# Patient Record
Sex: Female | Born: 1985 | Race: White | Hispanic: Yes | Marital: Married
Health system: Southern US, Community
[De-identification: ages and names within clinical notes are randomized; demographics above are authoritative.]

---

## 2004-04-01 ENCOUNTER — Ambulatory Visit (HOSPITAL_COMMUNITY): Admission: RE | Admit: 2004-04-01 | Discharge: 2004-04-01 | Payer: Self-pay | Admitting: *Deleted

## 2004-05-24 ENCOUNTER — Ambulatory Visit (HOSPITAL_COMMUNITY): Admission: RE | Admit: 2004-05-24 | Discharge: 2004-05-24 | Payer: Self-pay | Admitting: *Deleted

## 2004-06-22 ENCOUNTER — Ambulatory Visit (HOSPITAL_COMMUNITY): Admission: RE | Admit: 2004-06-22 | Discharge: 2004-06-22 | Payer: Self-pay | Admitting: *Deleted

## 2004-08-12 ENCOUNTER — Inpatient Hospital Stay (HOSPITAL_COMMUNITY): Admission: AD | Admit: 2004-08-12 | Discharge: 2004-08-12 | Payer: Self-pay | Admitting: Obstetrics & Gynecology

## 2004-08-12 ENCOUNTER — Ambulatory Visit: Payer: Self-pay | Admitting: Obstetrics & Gynecology

## 2004-08-13 ENCOUNTER — Ambulatory Visit: Payer: Self-pay | Admitting: Obstetrics & Gynecology

## 2004-08-13 ENCOUNTER — Observation Stay (HOSPITAL_COMMUNITY): Admission: AD | Admit: 2004-08-13 | Discharge: 2004-08-13 | Payer: Self-pay | Admitting: Obstetrics & Gynecology

## 2004-08-15 ENCOUNTER — Inpatient Hospital Stay (HOSPITAL_COMMUNITY): Admission: AD | Admit: 2004-08-15 | Discharge: 2004-08-17 | Payer: Self-pay | Admitting: Obstetrics and Gynecology

## 2010-08-08 ENCOUNTER — Emergency Department: Payer: Self-pay | Admitting: Internal Medicine

## 2014-02-03 ENCOUNTER — Emergency Department: Payer: Self-pay | Admitting: Emergency Medicine

## 2014-02-07 ENCOUNTER — Inpatient Hospital Stay: Payer: Self-pay | Admitting: General Practice

## 2014-07-26 NOTE — Op Note (Signed)
PATIENT NAME:  Jacqueline Saunders, Jacqueline Saunders MR#:  [id number] DATE OF BIRTH:  1985-08-19  DATE OF PROCEDURE:  02/07/2014  PREOPERATIVE DIAGNOSES:  1.  Right tibial shaft fracture.  2.  Right distal fibula fracture.   POSTOPERATIVE DIAGNOSES:  1.  Right tibial shaft fracture.  2.  Right distal fibula fracture.   PROCEDURES PERFORMED:  1.  Reduction and intramedullary nailing of the right tibial shaft fracture.  2.  Open reduction and internal fixation of a right distal fibula fracture.   SURGEON: Illene Labrador. Angie Fava., MD.   ANESTHESIA: Spinal.   ESTIMATED BLOOD LOSS: 100 mL.   FLUIDS REPLACED: 1300 mL of crystalloid.   TOURNIQUET TIME: 60 minutes.   DRAINS: None.   IMPLANTS UTILIZED: Synthes 10 mm x 300 mm titanium cannulated tibial nail, four 5.0 mm locking screws and a 5 mm titanium end cap, Synthes 7-hole 1/3 tubular plate with seven 3.5 mm cortical screws.   INDICATIONS FOR SURGERY: The patient is a 29 year old female who fell while getting out of a car and twisted her right lower leg. She sustained a fracture of the shaft of the right tibia as well as distal fibular fracture. After discussion of the risks and benefits of surgical intervention, the patient expressed understanding of the risks and benefits and agreed with plans for surgical intervention.   PROCEDURE IN DETAIL: The patient was brought to the Operating Room and, after adequate spinal anesthesia was achieved, a tourniquet was placed on the patient's upper right thigh. The patient's right leg was cleaned and prepped with alcohol and DuraPrep draped in the usual sterile fashion. A "timeout" was performed as per usual protocol. An anterior longitudinal incision was made along the medial aspect of the patellar tendon. Dissection was carried down to the anterior margin of the tibia. Under visualization with the C-arm, entry point was identified using a threaded Steinmann pin. The pin was advanced accordingly with good position noted. A  step drill was used to enlarge the opening. The Steinmann pin was then replaced with a beaded reaming rod. The reaming guidewire was then advanced down the intramedullary canal and past the fracture site into the distal fragment. Good position was noted in both AP and lateral planes. Measurements were obtained and it was felt that a 300 mm length for the nail was appropriate. The canal was reamed in a sequential fashion up to an 11 mm diameter. A 10 mm x 300 mm cannulated tibial nail was then advanced over the guidewire. Good position was noted and good maintenance of the reduction appreciated. Guidewire was removed. Proximal locking screws (5.0 mm) were inserted. Outrigger device was then removed. There was displacement of the distal fibula fracture, so it was elected to proceed with open reduction and internal fixation of the distal fibula for final locking of the tibial nail. The lower extremity was exsanguinated using an Esmarch and the tourniquet was inflated to 300 mmHg. A lateral longitudinal incision was made in line with the distal fibula. Dissection was carried down to the fracture site. Soft tissue and hematoma were removed from the fracture site and reduction was performed and maintained using bone reduction forceps with points. Reduction was confirmed using C-arm. A seven hole 1/3 tubular plate was then contoured to fit along the lateral aspect of the distal fibula and was then secured with a total of six 3.5 mm cortical screws. Excellent maintenance of the reduction was performed. Next, the C-arm was positioned so as to insert two 5.0 mm distal  locking screws in the tibial nail. Good position was noted in both cases. The wounds were irrigated with copious amounts of normal saline with antibiotic solution. The fascia adjacent to the patellar tendon was reapproximated using #1 Vicryl. The wounds were then closed in layers using first #0 Vicryl followed by 2-0 Vicryl. Skin was closed with skin staples.  Tourniquet was deflated after a total tourniquet time of 60 minutes. A posterior splint was applied.   The patient tolerated the procedure well. She was transported to the recovery room in stable condition.    ____________________________ Illene LabradorJames P. Angie FavaHooten Jr., MD jph:at D: 02/08/2014 06:53:43 ET T: 02/08/2014 11:13:12 ET JOB#: [id number]  cc: Fayrene FearingJames P. Angie FavaHooten Jr., MD, <Dictator> JAMES P Angie FavaHOOTEN JR MD ELECTRONICALLY SIGNED 02/09/2014 23:01

## 2014-07-26 NOTE — Discharge Summary (Signed)
PATIENT NAME:  Jacqueline Saunders, Jacqueline Saunders MR#:  [id number] DATE OF BIRTH:  02-16-1986  DATE OF ADMISSION:  02/07/2014 DATE OF DISCHARGE:  02/09/2014  ADMITTING DIAGNOSIS: Right tibial shaft fracture and right distal fibula fracture.   DISCHARGE DIAGNOSIS: Right tibial shaft fracture and right distal fibula fracture.   SURGERY: On 02/07/2014, ORIF right distal tibia, IM nail right tibia.   SURGEON: Dr. Francesco SorJames Saunders.   ANESTHESIA: Spinal.   ESTIMATED BLOOD LOSS: 100 mL.  TOURNIQUET TIME: 60 minutes.  IMPLANTS: Synthes 10 x 300 mm tibial plate, four 5.0 mm locking screws, 5 mm end cap, 7-hole one-third tubular plate, six 3.5 cortical screws.   COMPLICATIONS: None.   HISTORY: Jacqueline BumpersLorena is a 29 year old Hispanic female who sustained an injury to her right lower leg on 02/03/2014 when she is getting out of her car, slipped on wet grass, sustaining a twisting injury to the right leg. Had immediate pain. [facility] the same day. X-rays confirmed distal tibia and fibular shaft fractures that were moderately displaced. She was placed in a posterior splint and told to be nonweightbearing. She was admitted for ORIF, right ankle.   PHYSICAL EXAMINATION: Well-developed, well-nourished. No acute distress. Spanish-speaking. No use of accessory muscles. No nausea, vomiting, or constipation. Tolerating diet.  Tolerating physical therapy. Incision right ankle. Posterior and stirrup splints on. No drainage from the incisions. Dressing clean and intact. Neurovascularly intact proximal and distal to splint. Leg elevated. Polar Care on. Able to wiggle toes. Posterior splint on. Ankle immobilized. Range of motion not assessed. AVIs and TEDs.    HOSPITAL COURSE: After initial admission on 02/07/2014, the patient underwent ORIF right ankle the same day. She had good pain control afterwards and was transported to the orthopedic floor. On postoperative day one, 02/08/2014, was on room  air, had no labs. Physical therapy was begun on that day, nonweightbearing right lower extremity. Polar Care was on and ankle was immobilized in posterior and stirrup splint. On postoperative day 2, she walked 50 feet with physical therapy, and was cleared to be discharged home.   CONDITION AT DISCHARGE: Stable.   DISPOSITION: The patient was sent home with home health physical therapy.   DISCHARGE INSTRUCTIONS: The patient will follow up clinically in orthopedics in 7 to 10 days for removal of her splint and to have her staples removed. Nonweightbearing right lower extremity. Continue icing with Polar Care. Elevate. Continue left knee thigh-high TED hose. Regular diet.   DISCHARGE MEDICATIONS: Please see discharge instructions for complete list of discharge medications.  ____________________________ Jacqueline Knick M. Haskel KhanBerndt, NP amb:sb D: 02/25/2014 12:41:04 ET T: 02/25/2014 12:50:20 ET JOB#: [id number]  cc: Jacqueline [place] M. Haskel KhanBerndt, NP, <Dictator> Jacqueline EkAPRIL M Giovannie Scerbo FNP ELECTRONICALLY SIGNED 03/06/2014 8:55

## 2015-03-15 IMAGING — CR RIGHT TIBIA AND FIBULA - 2 VIEW
1 series · 2 of 2 positions shown · non-contrast
Comparison: None.

CLINICAL DATA: Slipped on the wet pavement and fell.

EXAM:
RIGHT TIBIA AND FIBULA - 2 VIEW

[Series 1: x tib-fib ap right · 0.14mm/px · 2 of 2 slices shown]
[im 1/2]
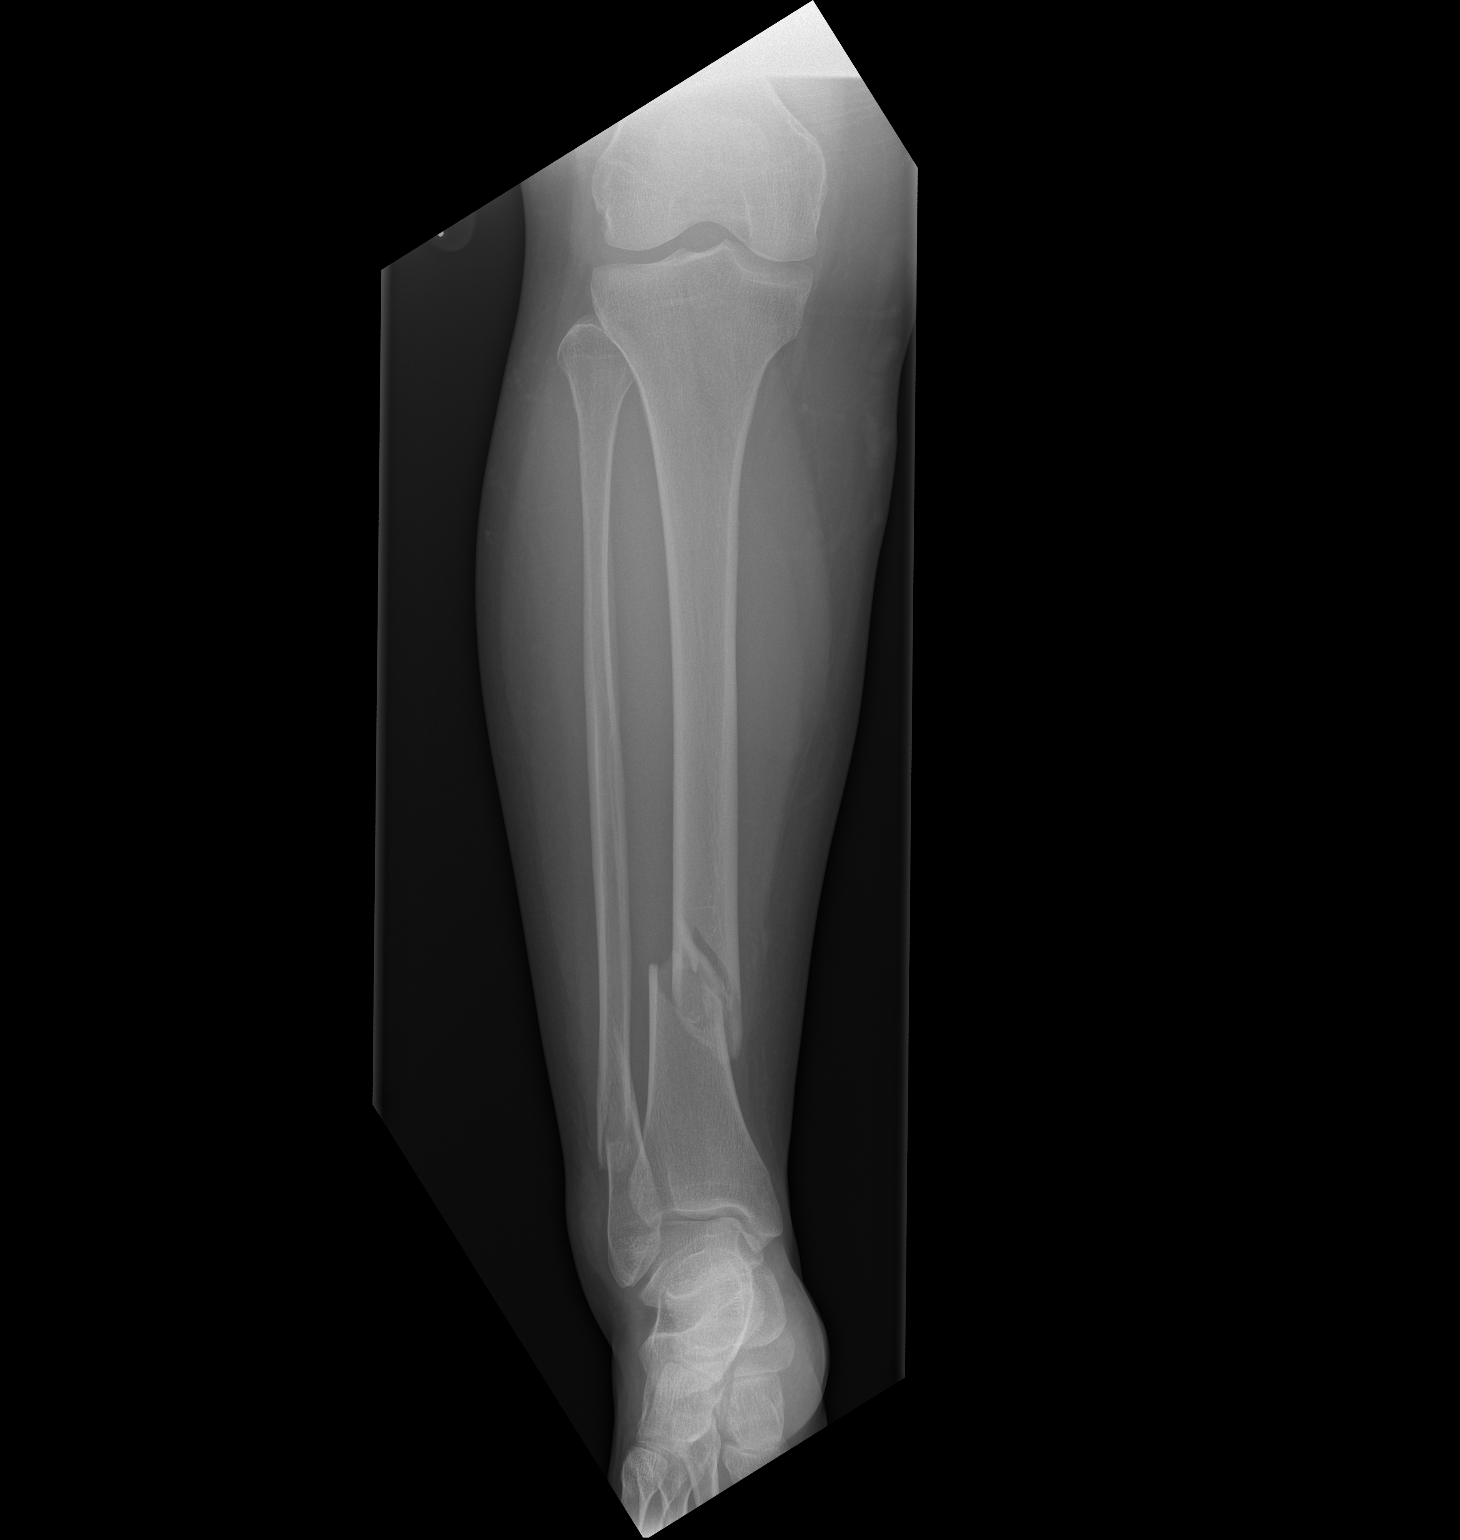
[im 2/2]
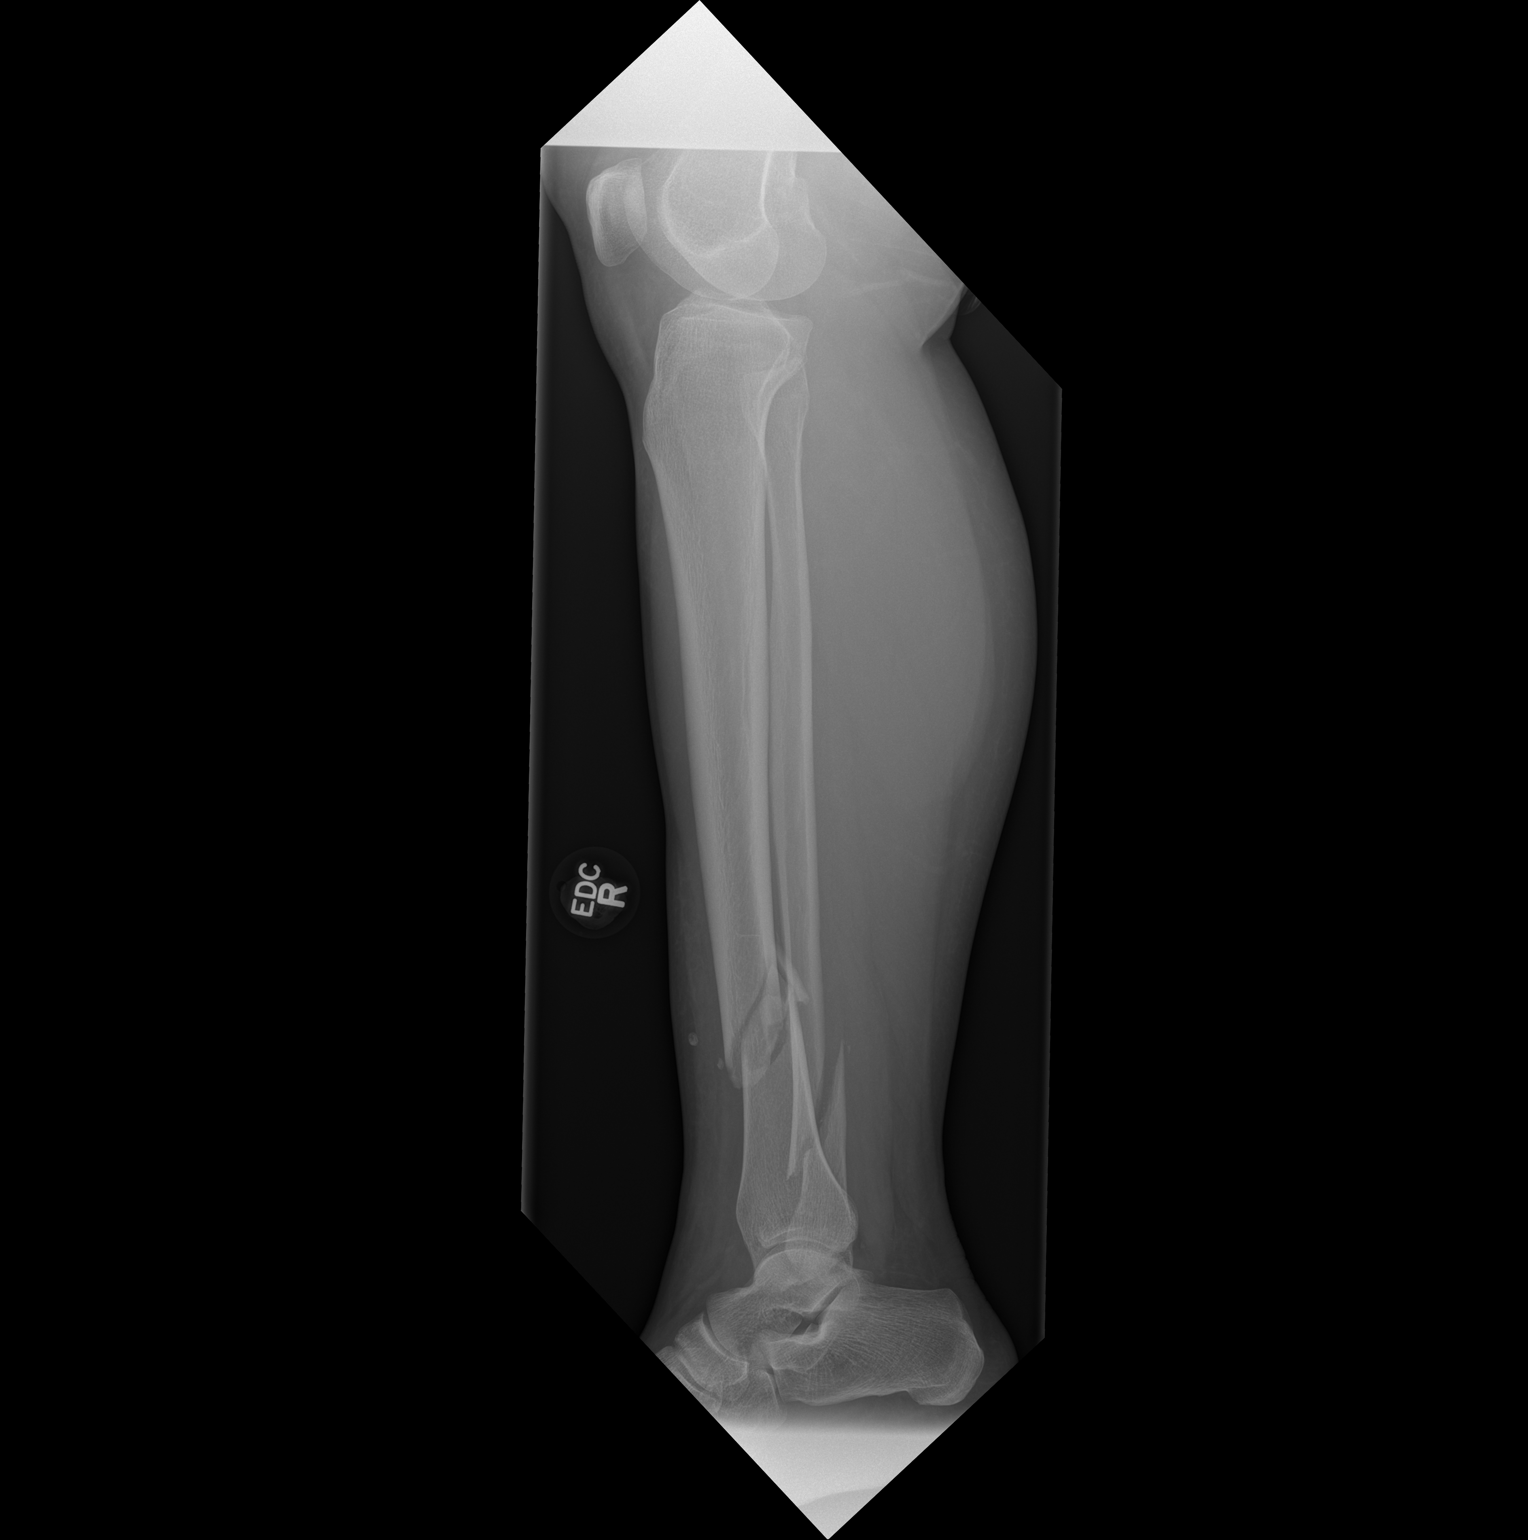

[2 of 2 positions shown; findings below may reference images not displayed]

FINDINGS: The knee and ankle joints are maintained. There are fractures
involving the distal tibia and fibular shafts. The tibia fracture
has a spiral appearance and there is mild lateral displacement. The
fibular fractures a long oblique fracture above the level of the
ankle mortise.
IMPRESSION: Distal tibia and fibular shaft fractures.

## 2015-03-19 IMAGING — CR RIGHT TIBIA AND FIBULA - 2 VIEW
1 series · 3 of 3 positions shown · non-contrast
Comparison: None.

CLINICAL DATA: . reduction internal fixation tibial fracture.

EXAM:
RIGHT TIBIA AND FIBULA - 2 VIEW

[Series 1: ap · 0.17mm/px · 3 of 3 slices shown]
[im 1/3]
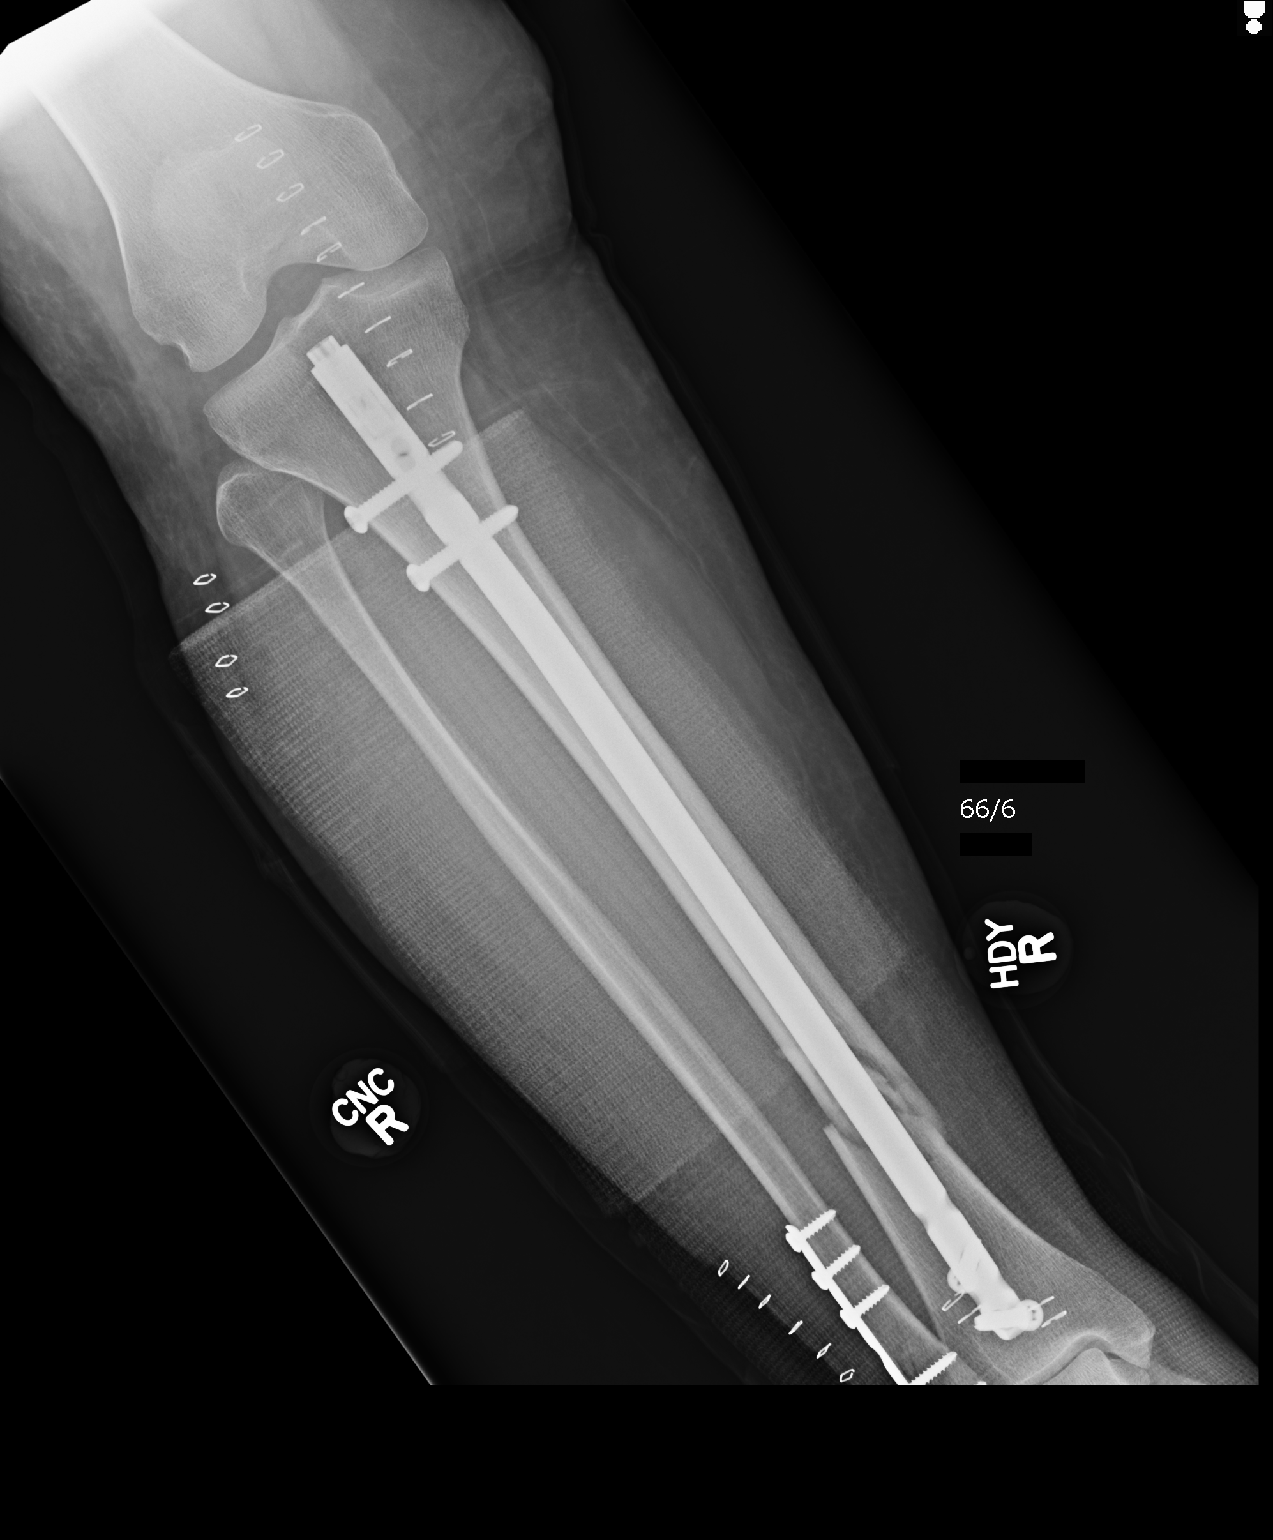
[im 2/3]
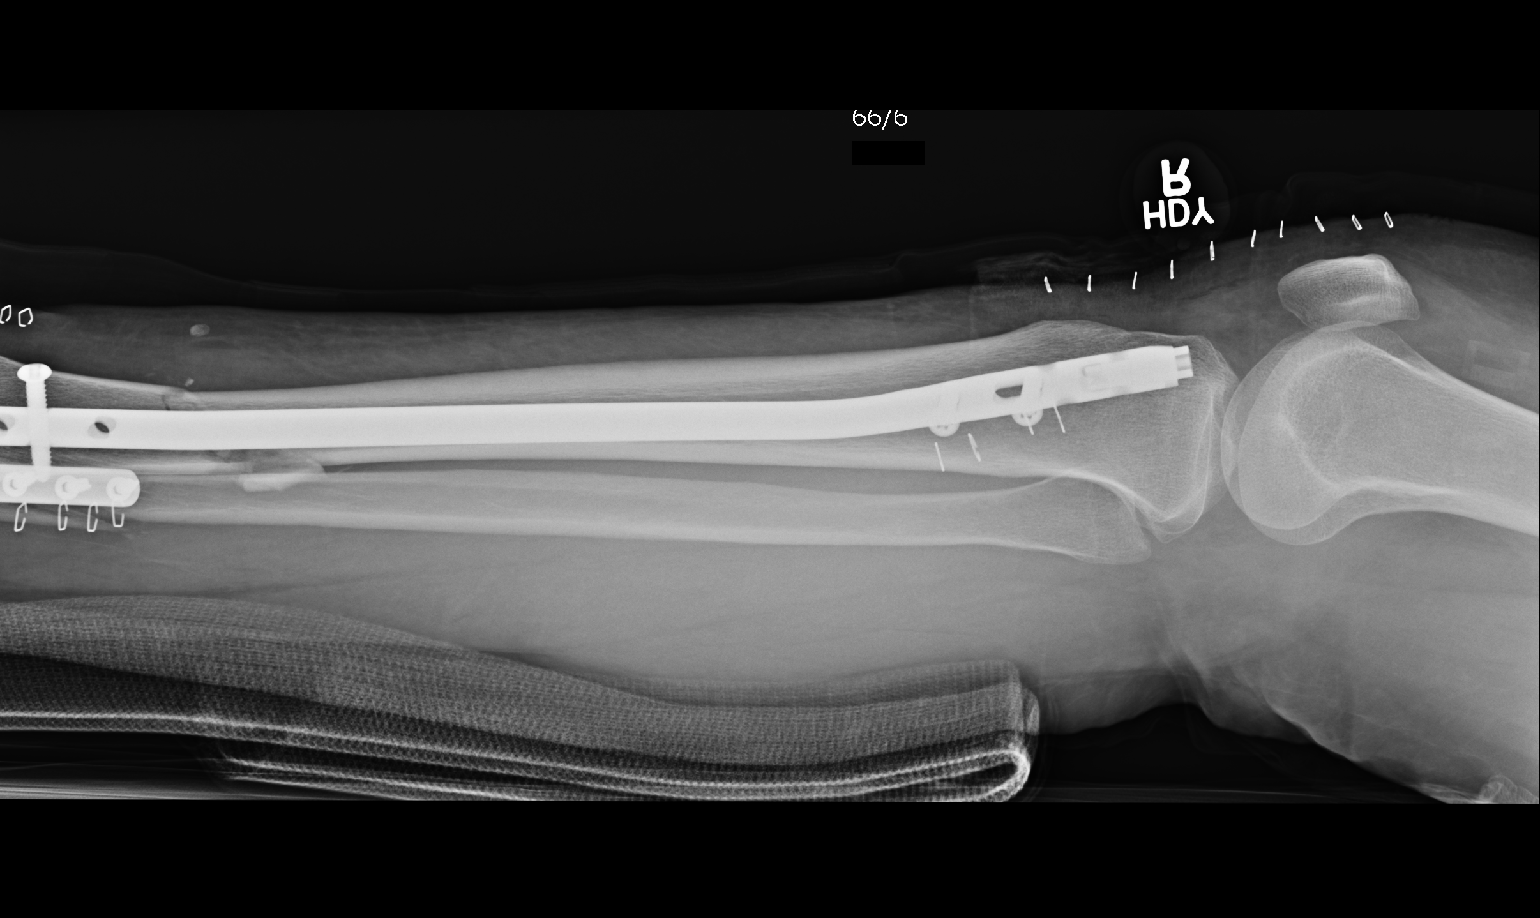
[im 3/3]
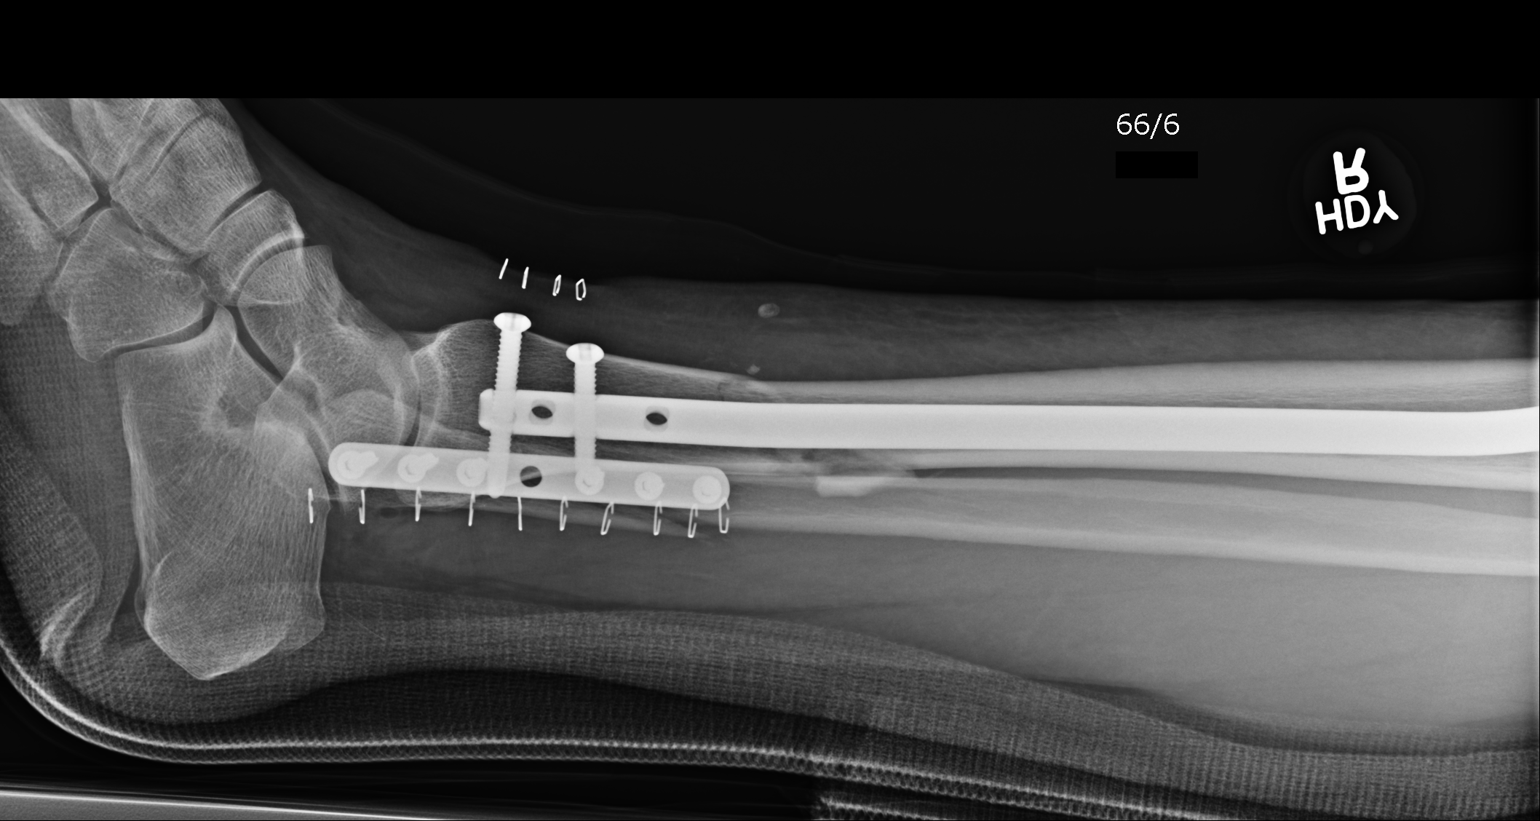

[3 of 3 positions shown; findings below may reference images not displayed]

FINDINGS: Patient status post open reduction i am nternal fixation of distal
tibial comminuted fracture with near anatomic alignment. Plate and
screw fixation of distal fibular fracture with near anatomic
alignment also present. Patient's in a posterior splint.
IMPRESSION: Patient status post open reduction internal fixation of distal tibia
and fibular fractures with near anatomic alignment .
